# Patient Record
Sex: Female | Born: 1965
Health system: Southern US, Community
[De-identification: ages and names within clinical notes are randomized; demographics above are authoritative.]

## PROBLEM LIST (undated history)

## (undated) DIAGNOSIS — R519 Headache, unspecified: Secondary | ICD-10-CM

## (undated) DIAGNOSIS — T7840XA Allergy, unspecified, initial encounter: Secondary | ICD-10-CM

## (undated) DIAGNOSIS — K219 Gastro-esophageal reflux disease without esophagitis: Secondary | ICD-10-CM

## (undated) DIAGNOSIS — M3500 Sicca syndrome, unspecified: Secondary | ICD-10-CM

## (undated) DIAGNOSIS — R51 Headache: Secondary | ICD-10-CM

## (undated) HISTORY — PX: CHOLECYSTECTOMY: SHX55

## (undated) HISTORY — DX: Headache, unspecified: R51.9

## (undated) HISTORY — DX: Gastro-esophageal reflux disease without esophagitis: K21.9

## (undated) HISTORY — DX: Sjogren syndrome, unspecified: M35.00

## (undated) HISTORY — DX: Headache: R51

## (undated) HISTORY — DX: Allergy, unspecified, initial encounter: T78.40XA

---

## 2000-04-26 ENCOUNTER — Encounter: Payer: Self-pay | Admitting: Obstetrics and Gynecology

## 2000-04-26 ENCOUNTER — Encounter: Admission: RE | Admit: 2000-04-26 | Discharge: 2000-04-26 | Payer: Self-pay | Admitting: Obstetrics and Gynecology

## 2000-06-19 ENCOUNTER — Other Ambulatory Visit: Admission: RE | Admit: 2000-06-19 | Discharge: 2000-06-19 | Payer: Self-pay | Admitting: Obstetrics and Gynecology

## 2001-07-18 ENCOUNTER — Other Ambulatory Visit: Admission: RE | Admit: 2001-07-18 | Discharge: 2001-07-18 | Payer: Self-pay | Admitting: Obstetrics and Gynecology

## 2002-11-21 ENCOUNTER — Other Ambulatory Visit: Admission: RE | Admit: 2002-11-21 | Discharge: 2002-11-21 | Payer: Self-pay | Admitting: Obstetrics and Gynecology

## 2013-07-23 ENCOUNTER — Ambulatory Visit (INDEPENDENT_AMBULATORY_CARE_PROVIDER_SITE_OTHER): Payer: PRIVATE HEALTH INSURANCE | Admitting: Family

## 2013-07-23 ENCOUNTER — Encounter: Payer: Self-pay | Admitting: Family

## 2013-07-23 VITALS — BP 130/90 | HR 67 | Ht 64.5 in | Wt 163.0 lb

## 2013-07-23 DIAGNOSIS — R682 Dry mouth, unspecified: Secondary | ICD-10-CM

## 2013-07-23 DIAGNOSIS — H04123 Dry eye syndrome of bilateral lacrimal glands: Secondary | ICD-10-CM

## 2013-07-23 DIAGNOSIS — H04129 Dry eye syndrome of unspecified lacrimal gland: Secondary | ICD-10-CM

## 2013-07-23 DIAGNOSIS — K117 Disturbances of salivary secretion: Secondary | ICD-10-CM

## 2013-07-23 DIAGNOSIS — Z23 Encounter for immunization: Secondary | ICD-10-CM

## 2013-07-23 LAB — POCT URINALYSIS DIPSTICK
Bilirubin, UA: NEGATIVE
Glucose, UA: NEGATIVE
Ketones, UA: NEGATIVE
Nitrite, UA: NEGATIVE
Protein, UA: NEGATIVE
Spec Grav, UA: 1.02
Urobilinogen, UA: 0.2
pH, UA: 5.5

## 2013-07-23 LAB — RHEUMATOID FACTOR: Rhuematoid fact SerPl-aCnc: 12 IU/mL (ref <=14)

## 2013-07-23 NOTE — Patient Instructions (Signed)
Sjgren's Syndrome Sjgren's syndrome is a disease in which the body's natural defense system (immune system) turns against the body's own cells and attacks the body's glands that produce tears and saliva. Sjgren's syndrome is sometimes linked to rheumatic disorders, such as rheumatoid arthritis and lupus. It is 10 times more common in women than in men. Most people with Sjgren's syndrome are from 45 to 46 years of age. CAUSES  The cause is unknown. It runs in families. It is possible that a trigger, such as a viral infection, can set it off. SYMPTOMS  The main symptoms are:  Dry mouth.  Chalky feeling, mouth feels like it is full of cotton.  Difficulty swallowing, speaking, or tasting.  Prone to cavities and mouth infections.  Dry eyes.  Burning, itching, feels like sand in the eyes.  Blurry vision.  Light sensitive. Other symptoms may include:  Skin, nose, and vaginal dryness.  Joint pain, stiffness, and muscle pain. Other organs that may be affected include:  Kidneys.  Blood vessels.  Lungs.  Liver.  Pancreas.  Brain. DIAGNOSIS  Diagnosis is first based on symptoms, medical history, and physical exam. Tests may also be done, including:  Tear production test (Schirmer's test).  A thorough eye exam using a magnifying device (slit-lamp exam).  Test to see the extent of eye damage using dye staining.  Mouth exam to look for signs of salivary gland swelling and mouth dryness.  Removal of a minor salivary gland from inside the lower lip to be studied under a microscope (lip biopsy).  Blood tests. Routine and special blood studies will be checked. Antibodies that attack normal tissue (autoantibodies) may be present, such as antinuclear antibodies (ANAs), rheumatoid factors, and Sjgren's antibodies (anti-SSA and anti-SSB).  Chest X-ray.  Urine tests (urinalysis). TREATMENT  There is no known cure for this syndrome. There is no specific treatment to restore  gland secretion, either. Treatment varies and is generally based upon the problems present.  Moisture replacement therapies may ease the symptoms of dryness.  Nonsteroidal anti-inflammatory drugs (NSAIDs), such as ibuprofen, may be used to treat musculoskeletal symptoms.  Corticosteroids, such as prednisone, may be given for people with severe complications.  Immunosuppressive drugs may be prescribed to control overactivity of the immune system. In severe cases, this overactivity can lead to organ damage.  A surgical procedure called punctal occlusion may be considered. This is done to close the tear ducts, helping to keep more natural tears on the eye's surface. A small percentage of people with Sjgren's syndrome develop lymphoma. If you are worried that you might develop lymphoma, talk to your caregiver to learn more about the disease and the symptoms to watch. HOME CARE INSTRUCTIONS   Eye care:  Use eyedrops as specified by your caregiver.  Try to blink 5 to 6 times per minute.  Protect your eyes from drafts and breezes.  Maintain properly humidified air.  Avoid smoke.  Mouth care:  Sugar-free gum and hard candy can help in some people.  Take frequent sips of water or sugar-free drinks.  Use lip balm, saliva substitutes, and prescription medicines as directed. SEEK MEDICAL CARE IF:   You have an oral temperature above 102 F (38.9 C).  You have night sweats.  You develop constant fatigue.  You have unexplained weight loss.  You develop itchy skin.  You have reddened patches on the skin. FOR MORE INFORMATION  Sjgren's Syndrome Foundation: www.sjogrens.org National Institute of Arthritis and Musculoskeletal and Skin Diseases: www.niams.nih.gov Document Released: 09/22/2002   Document Revised: 12/25/2011 Document Reviewed: 02/07/2010 [employer] Patient Information 2014 [employer].  

## 2013-07-23 NOTE — Progress Notes (Signed)
  Subjective:    Patient ID: Jane Solomon, female    DOB: [birth year], 47 y.o.   MRN: [id number]  HPI 47 year old white female, new patient to the practice and then to be established. She has concerns of dry eyes, dry mouth, and dryness of the vaginal area. Her dentist has become concerned more recently because she is developing more and more cavities this is an ongoing issue x1 year. Denies any history of any autoimmune disorders personally or familial.   Review of Systems  Constitutional: Negative.   HENT: Positive for dental problem. Negative for ear pain, nosebleeds and postnasal drip.        Dry mouth  Eyes:       Dry eyes  Respiratory: Negative.   Cardiovascular: Negative.   Genitourinary: Negative for vaginal bleeding.       Vaginal dryness  Musculoskeletal: Negative.   Skin: Negative.   Allergic/Immunologic: Negative.   Neurological: Negative.   Hematological: Negative.   Psychiatric/Behavioral: Negative.    Past Medical History  Diagnosis Date  . GERD (gastroesophageal reflux disease)   . Allergy   . Frequent headaches     History   Social History  . Marital Status: Single    Spouse Name: N/A    Number of Children: N/A  . Years of Education: N/A   Occupational History  . Not on file.   Social History Main Topics  . Smoking status: Never Smoker   . Smokeless tobacco: Not on file  . Alcohol Use: Yes  . Drug Use: No  . Sexual Activity: Not on file   Other Topics Concern  . Not on file   Social History Narrative  . No narrative on file    Past Surgical History  Procedure Laterality Date  . Cholecystectomy      No family history on file.  No Known Allergies  No current outpatient prescriptions on file prior to visit.   No current facility-administered medications on file prior to visit.    BP 130/90  Pulse 67  Ht 5' 4.5" (1.638 m)  Wt 163 lb (73.936 kg)  BMI 27.56 kg/m2  LMP 09/23/2014chart    Objective:   Physical Exam  Constitutional:  She appears well-developed and well-nourished.  HENT:  Right Ear: External ear normal.  Left Ear: External ear normal.  Nose: Nose normal.  Mouth/Throat: Oropharynx is clear and moist.  Neck: Normal range of motion. Neck supple.  Cardiovascular: Normal rate, regular rhythm and normal heart sounds.   Pulmonary/Chest: Effort normal and breath sounds normal.  Musculoskeletal: Normal range of motion.  Neurological: She is alert.  Skin: Skin is warm and dry.  Psychiatric: She has a normal mood and affect.          Assessment & Plan:  Assessment: 1. Dry eyes 2. Dry mouth 3. Rule out Sjogren's syndrome  Plan: Labs sent to include BMP, LFTs, CBC, TSH, ANA, RA note the patient and the results. Followup pending labs. Will consider Salagen orally pending results.

## 2013-07-24 ENCOUNTER — Telehealth: Payer: Self-pay | Admitting: Family

## 2013-07-24 ENCOUNTER — Other Ambulatory Visit: Payer: Self-pay | Admitting: Family

## 2013-07-24 DIAGNOSIS — R768 Other specified abnormal immunological findings in serum: Secondary | ICD-10-CM

## 2013-07-24 LAB — BASIC METABOLIC PANEL
BUN: 12 mg/dL (ref 6–23)
CO2: 25 mEq/L (ref 19–32)
Calcium: 9.5 mg/dL (ref 8.4–10.5)
Chloride: 106 mEq/L (ref 96–112)
Creatinine, Ser: 0.8 mg/dL (ref 0.4–1.2)
GFR: 83.05 mL/min (ref >60.00)
Glucose, Bld: 108 mg/dL — ABNORMAL HIGH (ref 70–99)
Potassium: 4.3 mEq/L (ref 3.5–5.1)
Sodium: 141 mEq/L (ref 135–145)

## 2013-07-24 LAB — HEPATIC FUNCTION PANEL
ALT: 25 U/L (ref 0–35)
AST: 24 U/L (ref 0–37)
Albumin: 4.7 g/dL (ref 3.5–5.2)
Alkaline Phosphatase: 44 U/L (ref 39–117)
Bilirubin, Direct: 0.1 mg/dL (ref 0.0–0.3)
Total Bilirubin: 1.5 mg/dL — ABNORMAL HIGH (ref 0.3–1.2)
Total Protein: 7.4 g/dL (ref 6.0–8.3)

## 2013-07-24 LAB — ANA: Anti Nuclear Antibody(ANA): POSITIVE — AB

## 2013-07-24 LAB — TSH: TSH: 1.01 u[IU]/mL (ref 0.35–5.50)

## 2013-07-24 LAB — ANTI-NUCLEAR AB-TITER (ANA TITER): ANA Titer 1: 1:40 {titer} — ABNORMAL HIGH

## 2013-07-24 NOTE — Telephone Encounter (Signed)
Pt states that she received her flu shot yesterday. After she reviewed her mychart, and I reviewed her visit yesterday, I don't see any indication that she received it. If you did in fact administer it, please notify me after you update her chart. Thank you!

## 2013-07-25 NOTE — Addendum Note (Signed)
Addended by: Beverely Low on: 07/25/2013 08:24 AM   Modules accepted: Orders

## 2013-07-25 NOTE — Telephone Encounter (Signed)
Flu shot documented

## 2014-06-19 ENCOUNTER — Telehealth: Payer: Self-pay | Admitting: Family

## 2014-06-25 NOTE — Telephone Encounter (Signed)
error 

## 2014-08-17 ENCOUNTER — Encounter: Payer: Self-pay | Admitting: Family

## 2015-04-07 ENCOUNTER — Encounter: Payer: Self-pay | Admitting: Family

## 2015-04-07 ENCOUNTER — Ambulatory Visit (INDEPENDENT_AMBULATORY_CARE_PROVIDER_SITE_OTHER): Payer: PRIVATE HEALTH INSURANCE | Admitting: Family

## 2015-04-07 VITALS — BP 112/80 | HR 80 | Temp 99.1°F | Wt 145.2 lb

## 2015-04-07 DIAGNOSIS — R17 Unspecified jaundice: Secondary | ICD-10-CM

## 2015-04-07 DIAGNOSIS — K648 Other hemorrhoids: Secondary | ICD-10-CM | POA: Diagnosis not present

## 2015-04-07 DIAGNOSIS — K6289 Other specified diseases of anus and rectum: Secondary | ICD-10-CM

## 2015-04-07 DIAGNOSIS — K644 Residual hemorrhoidal skin tags: Secondary | ICD-10-CM

## 2015-04-07 DIAGNOSIS — R198 Other specified symptoms and signs involving the digestive system and abdomen: Secondary | ICD-10-CM

## 2015-04-07 MED ORDER — HYDROCORTISONE ACETATE 25 MG RE SUPP: 25.0000 mg | Freq: Two times a day (BID) | RECTAL | Status: AC

## 2015-04-07 NOTE — Progress Notes (Signed)
Subjective:    Patient ID: Jane Solomon, female    DOB: 1966-06-18, 49 y.o.   MRN: [id number]  HPI 49 year old white female with a history of hemorrhoids is in today with concerns of elevated bilirubin. She was seen by rheumatology who suggested she come and have it evaluated. Denies any symptoms. At her last office visit her bilirubin was 1.5. Denies any history of any liver disease. No gallbladder disease.  Has a history of external hemorrhoids that are actively inflamed. Requesting a prescription for relief. Has tried over-the-counter Tucks pads and creams without much relief.  Review of Systems  Constitutional: Negative.   Respiratory: Negative.   Cardiovascular: Negative.   Gastrointestinal: Positive for rectal pain. Negative for diarrhea, constipation and blood in stool.  Endocrine: Negative.   Genitourinary: Negative.   Musculoskeletal: Negative.   Skin: Negative.   Allergic/Immunologic: Negative.   Neurological: Negative.   Psychiatric/Behavioral: Negative.    Past Medical History  Diagnosis Date  . GERD (gastroesophageal reflux disease)   . Allergy   . Frequent headaches     History   Social History  . Marital Status: Single    Spouse Name: N/A  . Number of Children: N/A  . Years of Education: N/A   Occupational History  . Not on file.   Social History Main Topics  . Smoking status: Never Smoker   . Smokeless tobacco: Not on file  . Alcohol Use: Yes  . Drug Use: No  . Sexual Activity: Not on file   Other Topics Concern  . Not on file   Social History Narrative    Past Surgical History  Procedure Laterality Date  . Cholecystectomy      History reviewed. No pertinent family history.  No Known Allergies  Current Outpatient Prescriptions on File Prior to Visit  Medication Sig Dispense Refill  . calcium gluconate 500 MG tablet Take 500 mg by mouth daily.    . Cholecalciferol (VITAMIN D) 2000 UNITS CAPS Take by mouth.    . fish oil-omega-3 fatty  acids 1000 MG capsule Take 2 g by mouth daily.     No current facility-administered medications on file prior to visit.    BP 112/80 mmHg  Pulse 80  Temp(Src) 99.1 F (37.3 C) (Oral)  Wt 145 lb 3.2 oz (65.862 kg)chart    Objective:   Physical Exam  Constitutional: She is oriented to person, place, and time. She appears well-developed and well-nourished.  HENT:  Right Ear: External ear normal.  Left Ear: External ear normal.  Nose: Nose normal.  Mouth/Throat: Oropharynx is clear and moist.  Neck: Normal range of motion. Neck supple. No thyromegaly present.  Cardiovascular: Normal rate, regular rhythm and normal heart sounds.   Pulmonary/Chest: Effort normal and breath sounds normal.  Abdominal: Soft. Bowel sounds are normal.  Musculoskeletal: Normal range of motion.  Neurological: She is alert and oriented to person, place, and time.  Skin: Skin is warm and dry.  Psychiatric: She has a normal mood and affect.          Assessment & Plan:  Jane Solomon was seen today for follow-up.  Diagnoses and all orders for this visit:  Elevated bilirubin Orders: -     US Abdomen Limited RUQ; Future  Abdominal fullness Orders: -     US Abdomen Limited RUQ; Future  External hemorrhoids without complication  Rectal pain  Other orders -     hydrocortisone (ANUSOL-HC) 25 MG suppository; Place 1 suppository (25 mg total) rectally  2 (two) times daily.   Call the office with any questions or concerns. Return pending ultrasound and sooner as needed.

## 2015-04-07 NOTE — Patient Instructions (Signed)

## 2015-04-07 NOTE — Progress Notes (Signed)
Pre visit review using our clinic review tool, if applicable. No additional management support is needed unless otherwise documented below in the visit note. 

## 2015-04-14 ENCOUNTER — Ambulatory Visit
Admission: RE | Admit: 2015-04-14 | Discharge: 2015-04-14 | Disposition: A | Discharge status: Home / Self Care | Payer: PRIVATE HEALTH INSURANCE | Source: Ambulatory Visit | Attending: Family | Admitting: Family

## 2015-04-14 DIAGNOSIS — R198 Other specified symptoms and signs involving the digestive system and abdomen: Secondary | ICD-10-CM

## 2015-04-14 DIAGNOSIS — R17 Unspecified jaundice: Secondary | ICD-10-CM

## 2015-04-16 ENCOUNTER — Encounter: Payer: Self-pay | Admitting: Family

## 2015-04-16 ENCOUNTER — Telehealth: Payer: Self-pay | Admitting: Family

## 2015-04-16 NOTE — Telephone Encounter (Signed)
Called and spoke to patient. Patient plans to call insurance and ask what medication they do cover and call the office back.

## 2015-04-16 NOTE — Telephone Encounter (Signed)
Left message for patient to call back  

## 2015-04-16 NOTE — Telephone Encounter (Signed)
PA for Anucort was denied.  Patient's plan states it is a non-formulary and patient will have to pay out of pocket.

## 2015-07-23 ENCOUNTER — Ambulatory Visit: Payer: PRIVATE HEALTH INSURANCE | Admitting: Family Medicine

## 2016-07-05 ENCOUNTER — Other Ambulatory Visit: Payer: Self-pay | Admitting: Obstetrics & Gynecology

## 2016-07-05 DIAGNOSIS — R928 Other abnormal and inconclusive findings on diagnostic imaging of breast: Secondary | ICD-10-CM

## 2016-07-07 ENCOUNTER — Ambulatory Visit
Admission: RE | Admit: 2016-07-07 | Discharge: 2016-07-07 | Disposition: A | Discharge status: Home / Self Care | Payer: PRIVATE HEALTH INSURANCE | Source: Ambulatory Visit | Attending: Obstetrics & Gynecology | Admitting: Obstetrics & Gynecology

## 2016-07-07 ENCOUNTER — Other Ambulatory Visit: Payer: Self-pay | Admitting: Obstetrics & Gynecology

## 2016-07-07 DIAGNOSIS — R921 Mammographic calcification found on diagnostic imaging of breast: Secondary | ICD-10-CM

## 2016-07-07 DIAGNOSIS — R928 Other abnormal and inconclusive findings on diagnostic imaging of breast: Secondary | ICD-10-CM

## 2016-10-03 DIAGNOSIS — M3500 Sicca syndrome, unspecified: Secondary | ICD-10-CM | POA: Diagnosis not present

## 2016-10-03 DIAGNOSIS — R5383 Other fatigue: Secondary | ICD-10-CM | POA: Diagnosis not present

## 2016-11-15 DIAGNOSIS — H524 Presbyopia: Secondary | ICD-10-CM | POA: Diagnosis not present

## 2017-04-03 DIAGNOSIS — L639 Alopecia areata, unspecified: Secondary | ICD-10-CM | POA: Diagnosis not present

## 2017-04-03 DIAGNOSIS — M3500 Sicca syndrome, unspecified: Secondary | ICD-10-CM | POA: Diagnosis not present

## 2017-04-03 DIAGNOSIS — R5383 Other fatigue: Secondary | ICD-10-CM | POA: Diagnosis not present

## 2017-06-27 ENCOUNTER — Other Ambulatory Visit: Payer: Self-pay | Admitting: Obstetrics and Gynecology

## 2017-06-27 ENCOUNTER — Other Ambulatory Visit: Payer: Self-pay | Admitting: Obstetrics & Gynecology

## 2017-06-27 DIAGNOSIS — N6489 Other specified disorders of breast: Secondary | ICD-10-CM | POA: Diagnosis not present

## 2017-06-27 DIAGNOSIS — R921 Mammographic calcification found on diagnostic imaging of breast: Secondary | ICD-10-CM

## 2017-07-03 ENCOUNTER — Ambulatory Visit
Admission: RE | Admit: 2017-07-03 | Discharge: 2017-07-03 | Disposition: A | Discharge status: Home / Self Care | Payer: 59 | Source: Ambulatory Visit | Attending: Obstetrics and Gynecology | Admitting: Obstetrics and Gynecology

## 2017-07-03 DIAGNOSIS — R921 Mammographic calcification found on diagnostic imaging of breast: Secondary | ICD-10-CM

## 2017-07-19 DIAGNOSIS — Z01419 Encounter for gynecological examination (general) (routine) without abnormal findings: Secondary | ICD-10-CM | POA: Diagnosis not present

## 2017-07-19 DIAGNOSIS — Z6826 Body mass index (BMI) 26.0-26.9, adult: Secondary | ICD-10-CM | POA: Diagnosis not present

## 2017-07-20 ENCOUNTER — Encounter: Payer: Self-pay | Admitting: Gastroenterology

## 2017-08-17 DIAGNOSIS — Z1329 Encounter for screening for other suspected endocrine disorder: Secondary | ICD-10-CM | POA: Diagnosis not present

## 2017-08-17 DIAGNOSIS — Z131 Encounter for screening for diabetes mellitus: Secondary | ICD-10-CM | POA: Diagnosis not present

## 2017-08-17 DIAGNOSIS — Z1321 Encounter for screening for nutritional disorder: Secondary | ICD-10-CM | POA: Diagnosis not present

## 2017-08-17 DIAGNOSIS — Z13820 Encounter for screening for osteoporosis: Secondary | ICD-10-CM | POA: Diagnosis not present

## 2017-08-17 DIAGNOSIS — Z13220 Encounter for screening for lipoid disorders: Secondary | ICD-10-CM | POA: Diagnosis not present

## 2017-09-21 ENCOUNTER — Encounter: Payer: 59 | Admitting: Gastroenterology

## 2017-09-27 ENCOUNTER — Encounter: Payer: 59 | Admitting: Gastroenterology

## 2017-10-18 ENCOUNTER — Ambulatory Visit (AMBULATORY_SURGERY_CENTER): Payer: 59 | Admitting: *Deleted

## 2017-10-18 ENCOUNTER — Other Ambulatory Visit: Payer: Self-pay

## 2017-10-18 VITALS — Ht 64.5 in | Wt 162.8 lb

## 2017-10-18 DIAGNOSIS — Z1211 Encounter for screening for malignant neoplasm of colon: Secondary | ICD-10-CM

## 2017-10-18 MED ORDER — NA SULFATE-K SULFATE-MG SULF 17.5-3.13-1.6 GM/177ML PO SOLN
1.0000 | Freq: Once | ORAL | 0 refills | Status: AC
Start: 2017-10-18 — End: 2017-10-18

## 2017-10-18 NOTE — Progress Notes (Signed)
Denies allergies to eggs or soy products. Denies complications with sedation or anesthesia. Denies O2 use. Denies use of diet or weight loss medications.  Emmi instructions given for colonoscopy.  

## 2017-10-26 ENCOUNTER — Encounter: Payer: Self-pay | Admitting: Gastroenterology

## 2017-11-02 ENCOUNTER — Other Ambulatory Visit: Payer: Self-pay

## 2017-11-02 ENCOUNTER — Ambulatory Visit (AMBULATORY_SURGERY_CENTER): Payer: 59 | Admitting: Gastroenterology

## 2017-11-02 ENCOUNTER — Encounter: Payer: Self-pay | Admitting: Gastroenterology

## 2017-11-02 VITALS — BP 122/64 | HR 71 | Temp 99.3°F | Resp 12 | Ht 64.5 in | Wt 162.0 lb

## 2017-11-02 DIAGNOSIS — Z1212 Encounter for screening for malignant neoplasm of rectum: Secondary | ICD-10-CM | POA: Diagnosis not present

## 2017-11-02 DIAGNOSIS — Z1211 Encounter for screening for malignant neoplasm of colon: Secondary | ICD-10-CM

## 2017-11-02 MED ORDER — SODIUM CHLORIDE 0.9 % IV SOLN: 500.0000 mL | Freq: Once | INTRAVENOUS | Status: AC

## 2017-11-02 NOTE — Op Note (Signed)
[facility] Patient Name: Jane Solomon Procedure Date: 11/02/2017 8:38 AM MRN: [id number] Endoscopist: Sherilyn Cooter L. Myrtie Neither , MD Age: 52 Referring MD:  Date of Birth: 09/28/1966 Gender: Female Account #: [id number] Procedure:                Colonoscopy Indications:              Screening for colorectal malignant neoplasm, This                            is the patient's first colonoscopy Medicines:                Monitored Anesthesia Care Procedure:                Pre-Anesthesia Assessment:                           - Prior to the procedure, a History and Physical                            was performed, and patient medications and                            allergies were reviewed. The patient's tolerance of                            previous anesthesia was also reviewed. The risks                            and benefits of the procedure and the sedation                            options and risks were discussed with the patient.                            All questions were answered, and informed consent                            was obtained. Prior Anticoagulants: The patient has                            taken no previous anticoagulant or antiplatelet                            agents. ASA Grade Assessment: II - A patient with                            mild systemic disease. After reviewing the risks                            and benefits, the patient was deemed in                            satisfactory condition to undergo the procedure.  After obtaining informed consent, the colonoscope                            was passed under direct vision. Throughout the                            procedure, the patient's blood pressure, pulse, and                            oxygen saturations were monitored continuously. The                            Colonoscope was introduced through the anus and                            advanced to the the terminal  ileum. The colonoscopy                            was performed without difficulty. The patient                            tolerated the procedure well. The quality of the                            bowel preparation was excellent. The terminal                            ileum, ileocecal valve, appendiceal orifice, and                            rectum were photographed. The quality of the bowel                            preparation was evaluated using the BBPS [facility]                            Bowel Preparation Scale) with scores of: Right                            Colon = 3, Transverse Colon = 3 and Left Colon = 3                            (entire mucosa seen well with no residual staining,                            small fragments of stool or opaque liquid). The                            total BBPS score equals 9. Scope In: 8:53:58 AM Scope Out: 9:10:39 AM Scope Withdrawal Time: 0 hours 9 minutes 16 seconds  Total Procedure Duration: 0 hours 16 minutes 41 seconds  Findings:  The perianal and digital rectal examinations were                            normal.                           The terminal ileum appeared normal.                           Multiple diverticula were found from transverse                            colon to rectum.                           The exam was otherwise without abnormality on                            direct and retroflexion views. Complications:            No immediate complications. Estimated Blood Loss:     Estimated blood loss: none. Impression:               - The examined portion of the ileum was normal.                           - Diverticulosis from transverse colon to rectum.                           - The examination was otherwise normal on direct                            and retroflexion views.                           - No specimens collected. Recommendation:           - Patient has a contact number available for                             emergencies. The signs and symptoms of potential                            delayed complications were discussed with the                            patient. Return to normal activities tomorrow.                            Written discharge instructions were provided to the                            patient.                           - Resume previous diet.                           -  Continue present medications.                           - Repeat colonoscopy in 10 years for screening                            purposes. Henry L. Myrtie Neitheranis, MD 11/02/2017 9:14:37 AM This report has been signed electronically.

## 2017-11-02 NOTE — Progress Notes (Signed)
Pt's states no medical or surgical changes since previsit or office visit. 

## 2017-11-02 NOTE — Patient Instructions (Signed)
YOU HAD AN ENDOSCOPIC PROCEDURE TODAY AT THE [facility]:   Refer to the procedure report that was given to you for any specific questions about what was found during the examination.  If the procedure report does not answer your questions, please call your gastroenterologist to clarify.  If you requested that your care partner not be given the details of your procedure findings, then the procedure report has been included in a sealed envelope for you to review at your convenience later.  YOU SHOULD EXPECT: Some feelings of bloating in the abdomen. Passage of more gas than usual.  Walking can help get rid of the air that was put into your GI tract during the procedure and reduce the bloating. If you had a lower endoscopy (such as a colonoscopy or flexible sigmoidoscopy) you may notice spotting of blood in your stool or on the toilet paper. If you underwent a bowel prep for your procedure, you may not have a normal bowel movement for a few days.  Please Note:  You might notice some irritation and congestion in your nose or some drainage.  This is from the oxygen used during your procedure.  There is no need for concern and it should clear up in a day or so.  SYMPTOMS TO REPORT IMMEDIATELY:   Following lower endoscopy (colonoscopy or flexible sigmoidoscopy):  Excessive amounts of blood in the stool  Significant tenderness or worsening of abdominal pains  Swelling of the abdomen that is new, acute  Fever of 100F or higher For urgent or emergent issues, a gastroenterologist can be reached at any hour by calling [phone].  DIET:  We do recommend a small meal at first, but then you may proceed to your regular diet.  Drink plenty of fluids but you should avoid alcoholic beverages for 24 hours.  ACTIVITY:  You should plan to take it easy for the rest of today and you should NOT DRIVE or use heavy machinery until tomorrow (because of the sedation medicines used during the test).     FOLLOW UP: Our staff will call the number listed on your records the next business day following your procedure to check on you and address any questions or concerns that you may have regarding the information given to you following your procedure. If we do not reach you, we will leave a message.  However, if you are feeling well and you are not experiencing any problems, there is no need to return our call.  We will assume that you have returned to your regular daily activities without incident.  SIGNATURES/CONFIDENTIALITY: You and/or your care partner have signed paperwork which will be entered into your electronic medical record.  These signatures attest to the fact that that the information above on your After Visit Summary has been reviewed and is understood.  Full responsibility of the confidentiality of this discharge information lies with you and/or your care-partner.  Next colonoscopy- 10 years  Please read over handout about diverticulosis  Continue your normal medications 

## 2017-11-02 NOTE — Progress Notes (Signed)
A and O x3. Report to RN. Tolerated MAC anesthesia well.

## 2017-11-05 ENCOUNTER — Telehealth: Payer: Self-pay

## 2017-11-05 NOTE — Telephone Encounter (Signed)
  Follow up Call-  Call back number 11/02/2017  Post procedure Call Back phone  # [phone]  Permission to leave phone message Yes  Some recent data might be hidden       lmom Angela/Recovery Room Follow-up call

## 2017-11-05 NOTE — Telephone Encounter (Signed)
  Follow up Call-  Call back number 11/02/2017  Post procedure Call Back phone  # [phone]  Permission to leave phone message Yes  Some recent data might be hidden     Patient questions:  Do you have a fever, pain , or abdominal swelling? No. Pain Score  0 *  Have you tolerated food without any problems? Yes.    Have you been able to return to your normal activities? Yes.    Do you have any questions about your discharge instructions: Diet   No. Medications  No. Follow up visit  No.  Do you have questions or concerns about your Care? No.  Actions: * If pain score is 4 or above: No action needed, pain <4.  No problems noted per pt. maw

## 2018-04-25 MED FILL — FLUORISHIELD 1.1% GEL: 1.1 % | 30 days supply | Qty: 114 | Fill #0

## 2018-07-11 ENCOUNTER — Other Ambulatory Visit: Payer: Self-pay | Admitting: Obstetrics and Gynecology

## 2018-07-11 DIAGNOSIS — R921 Mammographic calcification found on diagnostic imaging of breast: Secondary | ICD-10-CM

## 2018-07-26 ENCOUNTER — Ambulatory Visit
Admission: RE | Admit: 2018-07-26 | Discharge: 2018-07-26 | Disposition: A | Discharge status: Home / Self Care | Payer: 59 | Source: Ambulatory Visit | Attending: Obstetrics and Gynecology | Admitting: Obstetrics and Gynecology

## 2018-07-26 DIAGNOSIS — R921 Mammographic calcification found on diagnostic imaging of breast: Secondary | ICD-10-CM | POA: Diagnosis not present

## 2018-08-01 DIAGNOSIS — Z01419 Encounter for gynecological examination (general) (routine) without abnormal findings: Secondary | ICD-10-CM | POA: Diagnosis not present

## 2018-08-01 DIAGNOSIS — Z6827 Body mass index (BMI) 27.0-27.9, adult: Secondary | ICD-10-CM | POA: Diagnosis not present

## 2018-08-23 DIAGNOSIS — E785 Hyperlipidemia, unspecified: Secondary | ICD-10-CM | POA: Diagnosis not present

## 2018-08-23 DIAGNOSIS — Z13228 Encounter for screening for other metabolic disorders: Secondary | ICD-10-CM | POA: Diagnosis not present

## 2018-08-23 DIAGNOSIS — Z1321 Encounter for screening for nutritional disorder: Secondary | ICD-10-CM | POA: Diagnosis not present

## 2018-08-23 DIAGNOSIS — Z1329 Encounter for screening for other suspected endocrine disorder: Secondary | ICD-10-CM | POA: Diagnosis not present

## 2018-12-05 MED FILL — PREVIDENT 5000 1.1% DRY MOU: 1.1 | 30 days supply | Qty: 100 | Fill #0

## 2019-01-21 IMAGING — MG DIGITAL DIAGNOSTIC BILATERAL MAMMOGRAM WITH TOMO AND CAD
6 of 10 series · 6 of 26 positions shown · non-contrast
Comparison: Previous exam(s).

CLINICAL DATA: Followup probably benign left breast calcifications.

EXAM:
DIGITAL DIAGNOSTIC BILATERAL MAMMOGRAM WITH CAD AND TOMO

[L ML]
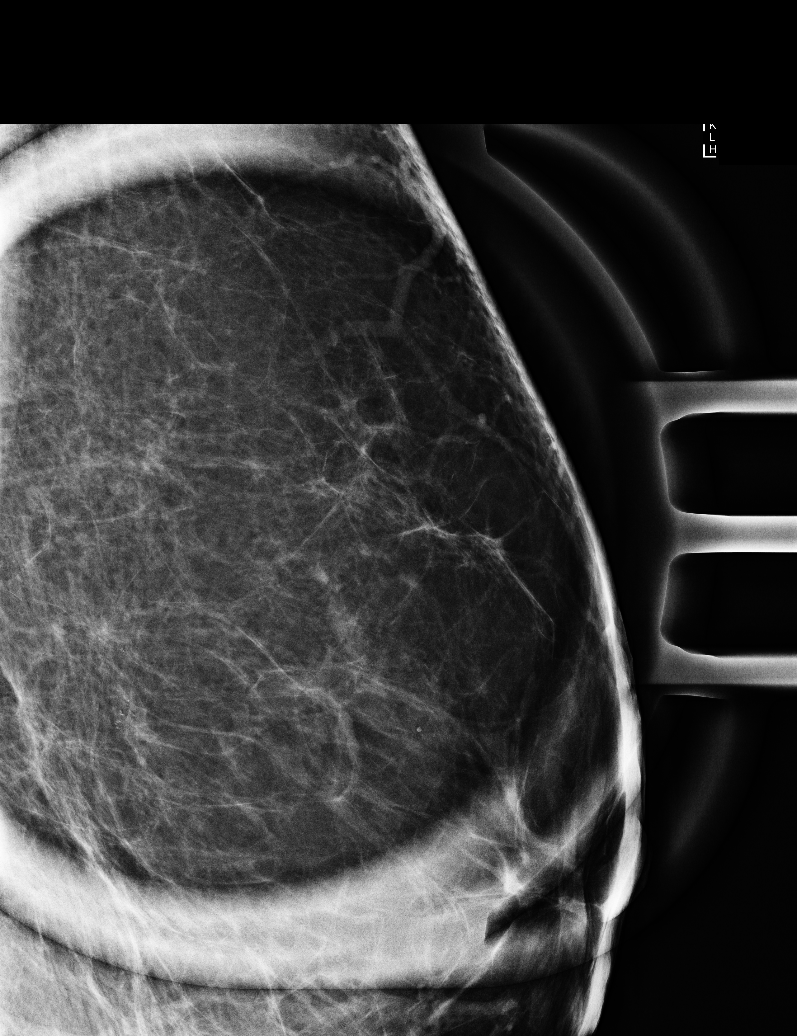

[L CC]
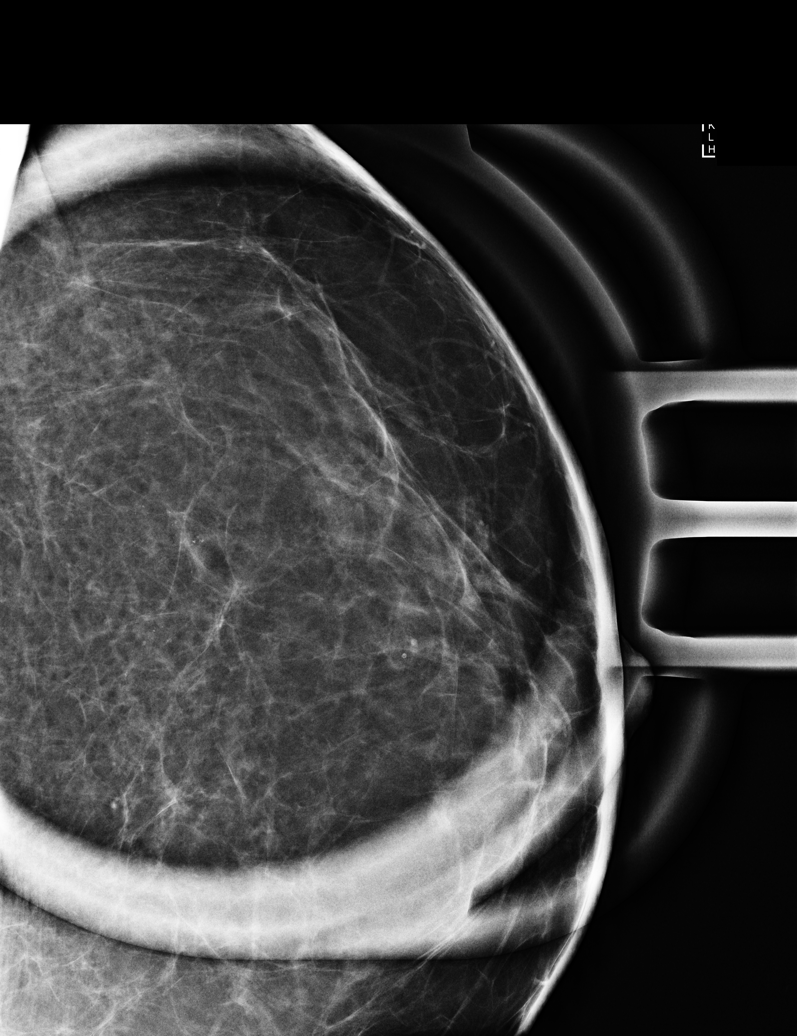

[R MLO synth-2D]
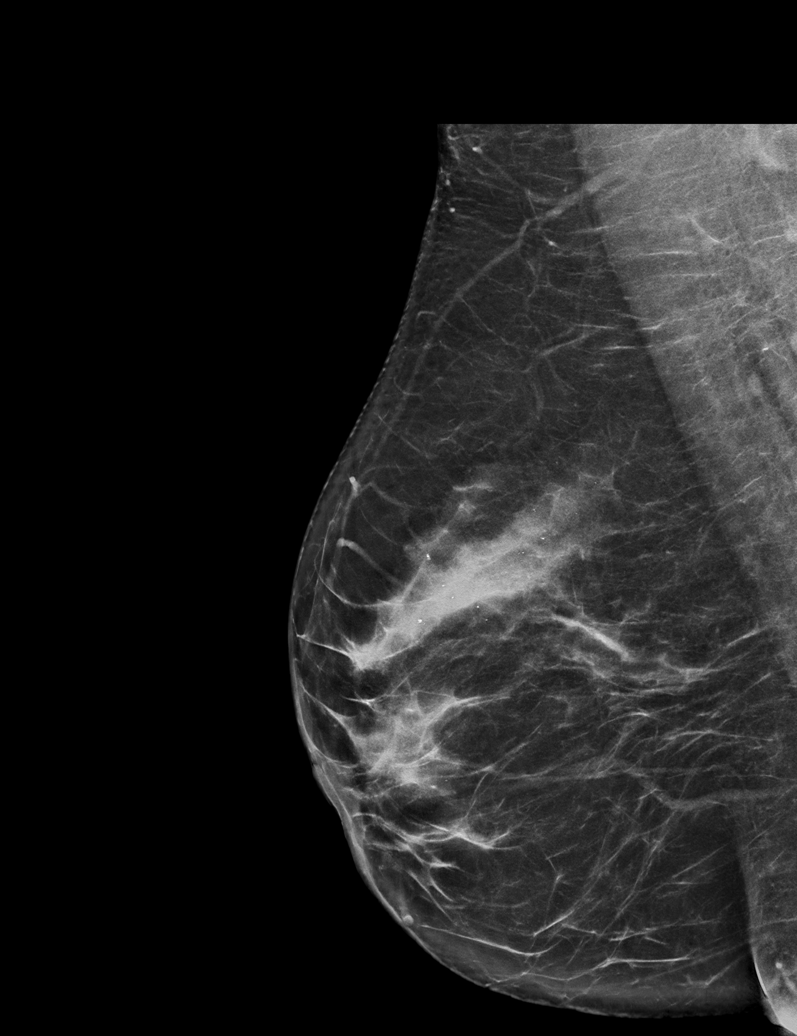

[L MLO synth-2D]
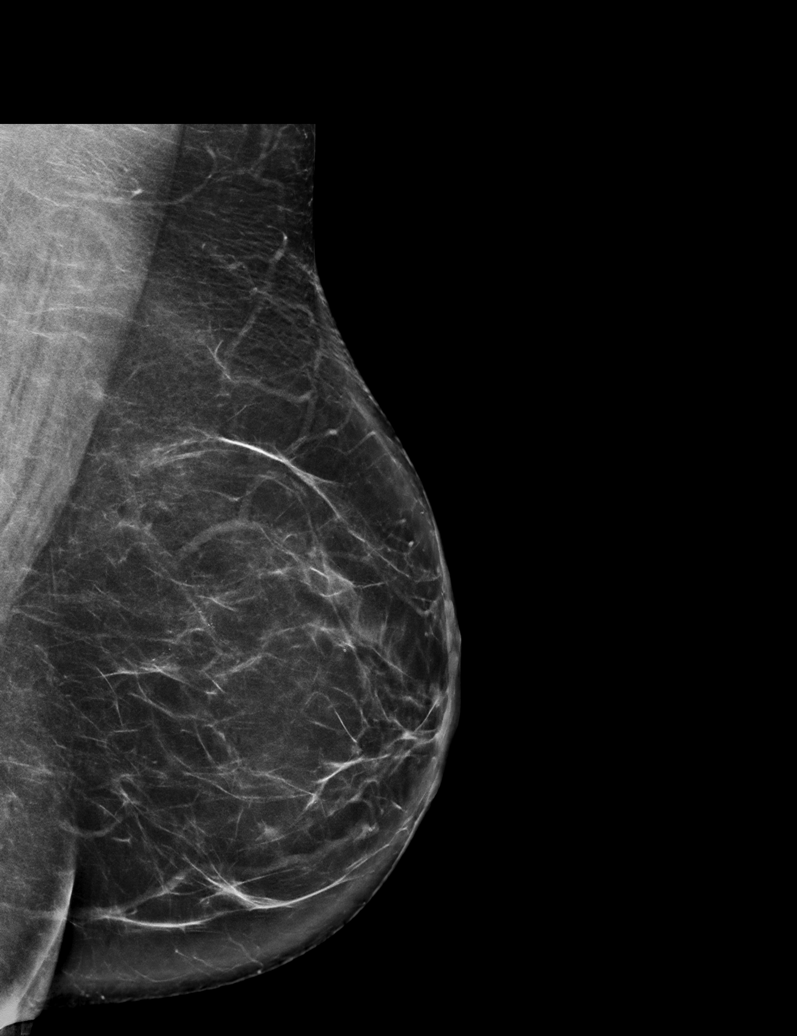

[R CC synth-2D]
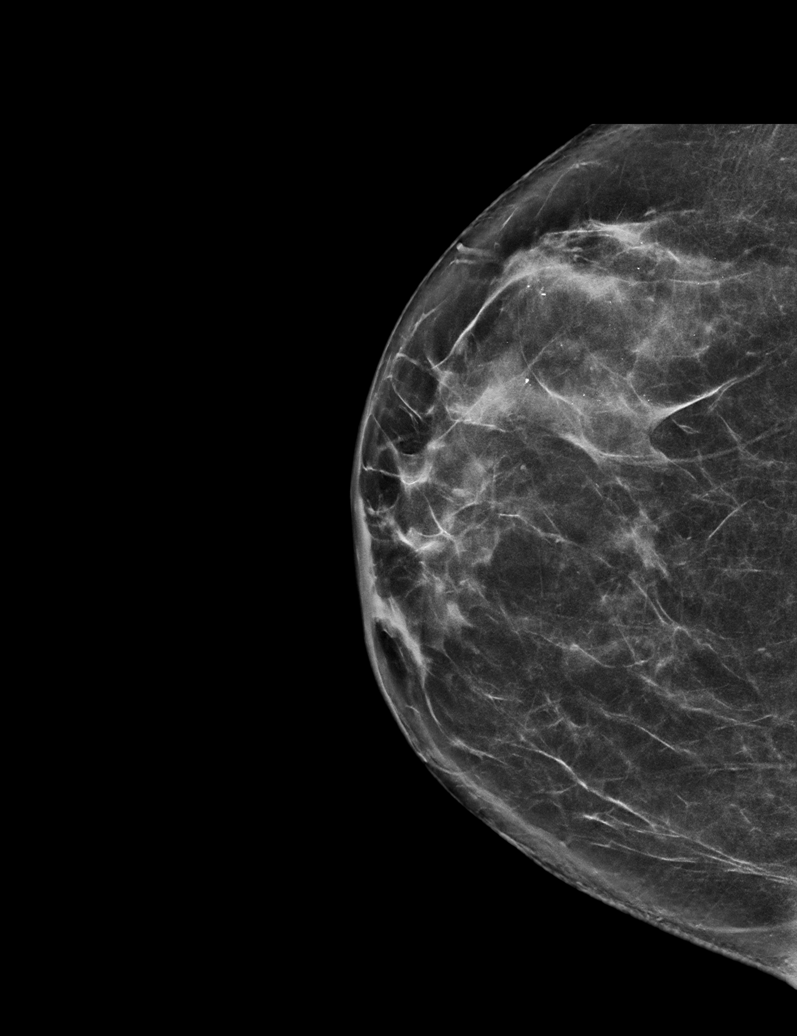

[L CC synth-2D]
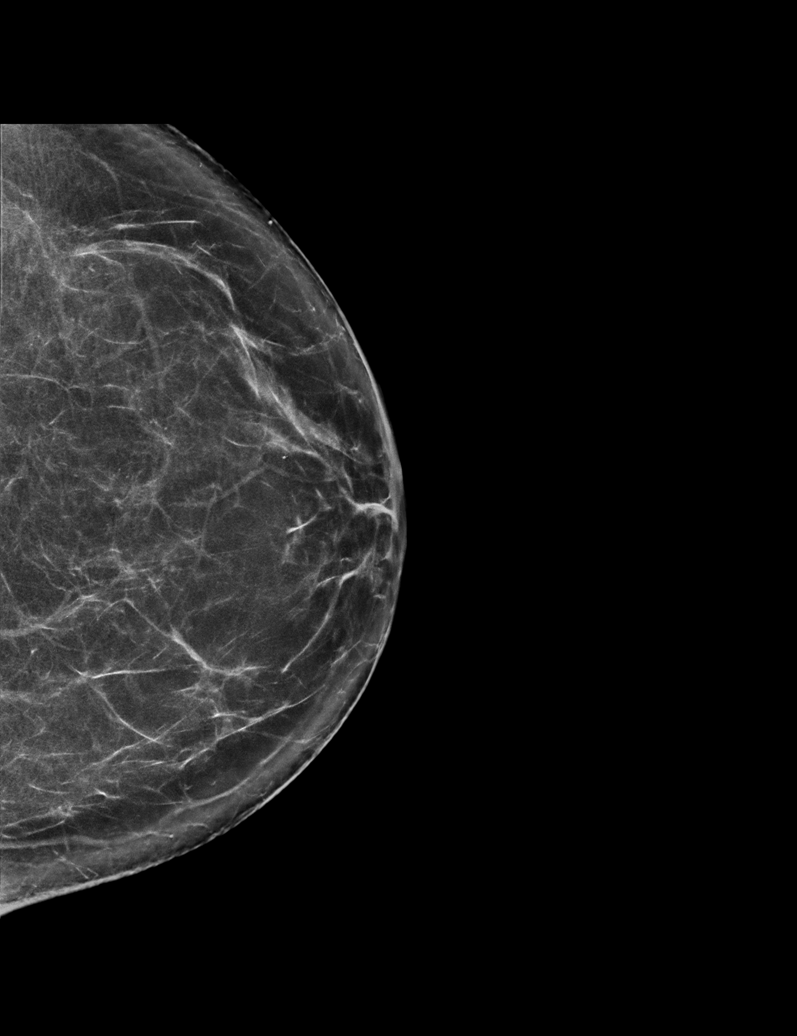

[6 of 26 positions shown; findings below may reference images not displayed]

ACR Breast Density Category b: There are scattered areas of
fibroglandular density.
FINDINGS: The previously described probably benign calcifications in the
slightly upper outer left breast have not changed significantly
since 07/07/2016 and 06/27/2016. Again, some of these demonstrate
dependent layering. No interval findings suspicious for malignancy
in either breast.

Mammographic images were processed with CAD.
IMPRESSION: 1. Stable left breast calcifications, including benign milk of
calcium. These do not require further follow-up.
2. No evidence of malignancy in either breast.

RECOMMENDATION:
Bilateral screening mammogram in 1 year.

I have discussed the findings and recommendations with the patient.
Results were also provided in writing at the conclusion of the
visit. If applicable, a reminder letter will be sent to the patient
regarding the next appointment.

BI-RADS CATEGORY  2: Benign.

## 2019-08-11 DIAGNOSIS — Z20828 Contact with and (suspected) exposure to other viral communicable diseases: Secondary | ICD-10-CM | POA: Diagnosis not present

## 2019-08-22 DIAGNOSIS — R5383 Other fatigue: Secondary | ICD-10-CM | POA: Diagnosis not present

## 2019-08-22 DIAGNOSIS — L639 Alopecia areata, unspecified: Secondary | ICD-10-CM | POA: Diagnosis not present

## 2019-08-22 DIAGNOSIS — M3500 Sicca syndrome, unspecified: Secondary | ICD-10-CM | POA: Diagnosis not present

## 2019-09-05 DIAGNOSIS — Z01419 Encounter for gynecological examination (general) (routine) without abnormal findings: Secondary | ICD-10-CM | POA: Diagnosis not present

## 2019-09-05 DIAGNOSIS — Z6825 Body mass index (BMI) 25.0-25.9, adult: Secondary | ICD-10-CM | POA: Diagnosis not present

## 2019-09-05 DIAGNOSIS — Z1231 Encounter for screening mammogram for malignant neoplasm of breast: Secondary | ICD-10-CM | POA: Diagnosis not present

## 2019-09-25 DIAGNOSIS — M3500 Sicca syndrome, unspecified: Secondary | ICD-10-CM | POA: Diagnosis not present

## 2019-10-15 ENCOUNTER — Other Ambulatory Visit: Payer: Self-pay

## 2019-10-15 ENCOUNTER — Other Ambulatory Visit (INDEPENDENT_AMBULATORY_CARE_PROVIDER_SITE_OTHER): Payer: Self-pay

## 2019-10-15 ENCOUNTER — Telehealth: Payer: Self-pay | Admitting: Neurology

## 2019-10-15 DIAGNOSIS — Z0289 Encounter for other administrative examinations: Secondary | ICD-10-CM

## 2019-10-15 DIAGNOSIS — U071 COVID-19: Secondary | ICD-10-CM

## 2019-10-15 NOTE — Addendum Note (Signed)
Addended by: Marcial Pacas on: 10/15/2019 04:36 PM   Modules accepted: Orders

## 2019-10-15 NOTE — Telephone Encounter (Signed)
Entered the lab test for Covid-19 antibody

## 2019-10-16 ENCOUNTER — Telehealth: Payer: Self-pay | Admitting: Neurology

## 2019-10-16 LAB — SAR COV2 SEROLOGY (COVID19)AB(IGG),IA: DiaSorin SARS-CoV-2 Ab, IgG: POSITIVE — AB

## 2019-10-16 NOTE — Telephone Encounter (Signed)
Messaged patient, COVID antibody IG was positive

## 2019-12-22 MED FILL — PREVIDENT 5000 1.1% DRY MOU: 1.1 | 30 days supply | Qty: 100 | Fill #0

## 2020-02-11 ENCOUNTER — Other Ambulatory Visit (INDEPENDENT_AMBULATORY_CARE_PROVIDER_SITE_OTHER): Payer: Self-pay

## 2020-02-11 ENCOUNTER — Other Ambulatory Visit: Payer: Self-pay

## 2020-02-11 ENCOUNTER — Other Ambulatory Visit: Payer: Self-pay | Admitting: Neurology

## 2020-02-11 DIAGNOSIS — Z0289 Encounter for other administrative examinations: Secondary | ICD-10-CM

## 2020-02-11 DIAGNOSIS — Z8616 Personal history of COVID-19: Secondary | ICD-10-CM

## 2020-02-12 LAB — SAR COV2 SEROLOGY (COVID19)AB(IGG),IA: DiaSorin SARS-CoV-2 Ab, IgG: POSITIVE
